# Patient Record
Sex: Female | Born: 1955 | Hispanic: No | Marital: Single
Health system: Southern US, Community
[De-identification: ages and names within clinical notes are randomized; demographics above are authoritative.]

---

## 2002-05-03 ENCOUNTER — Encounter: Admission: RE | Admit: 2002-05-03 | Discharge: 2002-05-03 | Payer: Self-pay | Admitting: Family Medicine

## 2002-05-03 ENCOUNTER — Encounter: Payer: Self-pay | Admitting: Family Medicine

## 2002-06-22 ENCOUNTER — Encounter: Admission: RE | Admit: 2002-06-22 | Discharge: 2002-06-22 | Payer: Self-pay | Admitting: *Deleted

## 2002-06-22 ENCOUNTER — Encounter: Payer: Self-pay | Admitting: *Deleted

## 2004-05-13 ENCOUNTER — Other Ambulatory Visit: Admission: RE | Admit: 2004-05-13 | Discharge: 2004-05-13 | Payer: Self-pay | Admitting: Family Medicine

## 2005-01-01 ENCOUNTER — Encounter: Admission: RE | Admit: 2005-01-01 | Discharge: 2005-01-01 | Payer: Self-pay | Admitting: Surgery

## 2005-01-26 ENCOUNTER — Ambulatory Visit (HOSPITAL_COMMUNITY): Admission: RE | Admit: 2005-01-26 | Discharge: 2005-01-26 | Payer: Self-pay | Admitting: Surgery

## 2005-01-26 ENCOUNTER — Encounter (INDEPENDENT_AMBULATORY_CARE_PROVIDER_SITE_OTHER): Payer: Self-pay | Admitting: *Deleted

## 2009-04-09 ENCOUNTER — Encounter: Admission: RE | Admit: 2009-04-09 | Discharge: 2009-04-09 | Payer: Self-pay | Admitting: Family Medicine

## 2010-07-11 NOTE — Op Note (Signed)
Sherri Hebert            ACCOUNT NO.:  [id number]   MEDICAL RECORD NO.:  [id number]          PATIENT TYPE:  AMB   LOCATION:  DAY                          FACILITY:  [facility]   PHYSICIAN:  Thomas A. Cornett, M.D.DATE OF BIRTH:  [birth year]   DATE OF PROCEDURE:  01/26/2005  DATE OF DISCHARGE:                                 OPERATIVE REPORT   PREOPERATIVE DIAGNOSIS:  Symptomatic cholelithiasis.   POSTOPERATIVE DIAGNOSIS:  Symptomatic cholelithiasis.   PROCEDURE:  Laparoscopic cholecystectomy with intraoperative cholangiogram.   SURGEON:  Dr. Harriette Bouillon.   ASSISTANT:  Dr. Sheppard Plumber. Davis.   ANESTHESIA:  General endotracheal anesthesia with 0.25% Sensorcaine local.   ESTIMATED BLOOD LOSS:  20 mL.   SPECIMEN:  Gallbladder with gallstones to pathology.   DRAINS:  None.   INDICATIONS FOR PROCEDURE:  The patient is a 55 year old female has had  progressive right upper quadrant pain and bouts of biliary colic off and on  for the last 2 years. Her attacks have worsened and she wished to have  cholecystectomy for treatment of symptomatic cholelithiasis. After informed  consent was obtained, the patient was brought to the operating room.   DESCRIPTION OF PROCEDURE:  The patient was brought to the operating room and  placed supine. After induction of general endotracheal anesthesia, the  abdomen was prepped and draped in a sterile fashion. A 1 cm supraumbilical  incision was made, dissection was carried down to the fascia and the fascia  was grasped with a Kocher. Prior to induction, antibiotics were given. A  small incision was made in the fascia and both sides of the fascia were then  grasped between separate Kocher's. I extended the incision to about a  centimeter. I placed a Kelly clamp into the preperitoneal space and pushed  through the peritoneum into the abdominal cavity. A pursestring suture of #0  Vicryl was placed and a 12-mm Hassan cannula was placed under direct  vision.  Pneumoperitoneum was then created to 15 mmHg and a laparoscope was placed.  Laparoscopy was done. No obvious hollow or solid organ injury was noted. The  gallbladder is identified and had some chronic inflammatory change. A 5 mL  port was placed in the subxiphoid position and two 5 mm ports were placed in  the right mid abdomen. The dome of the gallbladder was grasped and retracted  toward the patient's right shoulder. A second grasper was used to grab the  infundibulum. I scored the peritoneum with the cautery at the junction of  the cystic duct and gallbladder. I was able to dissect out the cystic duct  as the only tubular structure in the gallbladder. A clip was placed on the  gallbladder side of the cystic duct and a small incision was made in the  cystic duct for cholangiogram. Through a separate stab incision in the right  upper quadrant, a Cook catheter was introduced for cholangiogram. This was  placed in the cystic duct and held in place with a clip. Intraoperative  cholangiogram was then performed using fluoroscopy and 1/2 strength Hypaque  dye. There was a very long cystic duct  that appeared to enter posterior to  the common duct quite distal common duct. There was flow of contrast up the  common duct up into the right hepatic duct and I could visualize the  bifurcation of the right and left hepatic duct even though not a lot of  contrast went up the left hepatic duct.  There was free flow of contrast  into the duodenum. At this point, the cholangiogram catheter was removed and  the cystic duct stump was triple clipped and divided. The cystic artery was  identified. This was triple clipped and divided. There were numerous small  branches that were coming off the cystic artery since the gallbladder  appeared to be more on a mesentery than intrahepatic. It took these vessels  individually as I entered the gallbladder with clips, while other I was able  to cauterize due to  their small size. I was able to dissect the gallbladder  from the gallbladder fossa with good hemostasis. A 5 mm scope was then used  to extract the gallbladder through the umbilicus using an EndoCatch bag. I  then re-exchanged back to a 10 mm scope to examine the gallbladder bed.  Clips were on the cystic duct and cystic artery branches respectively with  no evidence of bleeding. There was good hemostasis within the gallbladder  bed. Irrigation was used and suctioned out. I reinspected the gallbladder  bed and found it to be hemostatic. There is no evidence of any solid or  hollow organ injury at this point in time. All ports were subsequently  removed with no signs of port site bleeding. The camera was withdrawn, the  CO2 was released and the umbilical port was passed off the field. The fascia  at the umbilicus was closed with the previously placed pursestring suture of  Vicryl. 4-0 Monocryl was used to close off skin incisions. Steri-Strips and  dry dressings were applied. All final counts of sponge, needle and  instruments were found to be correct at this portion of the case. The  patient was awoke and taken to recovery in satisfactory condition.      Thomas A. Cornett, M.D.  Electronically Signed     TAC/MEDQ  D:  01/26/2005  T:  01/26/2005  Job:  [id number]   cc:   Schuyler Amor, M.D.  Fax: [phone]

## 2013-03-30 ENCOUNTER — Other Ambulatory Visit: Payer: Self-pay | Admitting: Family Medicine

## 2013-03-30 ENCOUNTER — Ambulatory Visit
Admission: RE | Admit: 2013-03-30 | Discharge: 2013-03-30 | Disposition: A | Discharge status: Home / Self Care | Payer: BC Managed Care – PPO | Source: Ambulatory Visit | Attending: Family Medicine | Admitting: Family Medicine

## 2013-03-30 DIAGNOSIS — R079 Chest pain, unspecified: Secondary | ICD-10-CM

## 2013-03-30 DIAGNOSIS — N644 Mastodynia: Secondary | ICD-10-CM

## 2013-04-14 ENCOUNTER — Other Ambulatory Visit: Payer: Self-pay | Admitting: Family Medicine

## 2013-04-14 ENCOUNTER — Ambulatory Visit
Admission: RE | Admit: 2013-04-14 | Discharge: 2013-04-14 | Disposition: A | Discharge status: Home / Self Care | Payer: BC Managed Care – PPO | Source: Ambulatory Visit | Attending: Family Medicine | Admitting: Family Medicine

## 2013-04-14 DIAGNOSIS — N644 Mastodynia: Secondary | ICD-10-CM

## 2013-04-28 ENCOUNTER — Other Ambulatory Visit (HOSPITAL_COMMUNITY)
Admission: RE | Admit: 2013-04-28 | Discharge: 2013-04-28 | Disposition: A | Discharge status: Home / Self Care | Payer: BC Managed Care – PPO | Source: Ambulatory Visit | Attending: Family Medicine | Admitting: Family Medicine

## 2013-04-28 ENCOUNTER — Other Ambulatory Visit: Payer: Self-pay | Admitting: Family Medicine

## 2013-04-28 DIAGNOSIS — Z124 Encounter for screening for malignant neoplasm of cervix: Secondary | ICD-10-CM | POA: Insufficient documentation

## 2013-04-28 DIAGNOSIS — Z1151 Encounter for screening for human papillomavirus (HPV): Secondary | ICD-10-CM | POA: Insufficient documentation

## 2013-09-25 ENCOUNTER — Other Ambulatory Visit: Payer: Self-pay | Admitting: Family Medicine

## 2013-09-25 DIAGNOSIS — N63 Unspecified lump in unspecified breast: Secondary | ICD-10-CM

## 2013-10-12 ENCOUNTER — Encounter (INDEPENDENT_AMBULATORY_CARE_PROVIDER_SITE_OTHER): Payer: Self-pay

## 2013-10-12 ENCOUNTER — Ambulatory Visit
Admission: RE | Admit: 2013-10-12 | Discharge: 2013-10-12 | Disposition: A | Discharge status: Home / Self Care | Payer: BC Managed Care – PPO | Source: Ambulatory Visit | Attending: Family Medicine | Admitting: Family Medicine

## 2013-10-12 DIAGNOSIS — N63 Unspecified lump in unspecified breast: Secondary | ICD-10-CM

## 2014-03-22 ENCOUNTER — Other Ambulatory Visit: Payer: Self-pay | Admitting: Family Medicine

## 2014-03-22 DIAGNOSIS — N63 Unspecified lump in unspecified breast: Secondary | ICD-10-CM

## 2014-04-02 ENCOUNTER — Other Ambulatory Visit: Payer: Self-pay | Admitting: Internal Medicine

## 2014-04-02 DIAGNOSIS — R319 Hematuria, unspecified: Secondary | ICD-10-CM

## 2014-04-02 DIAGNOSIS — R109 Unspecified abdominal pain: Secondary | ICD-10-CM

## 2014-04-16 ENCOUNTER — Ambulatory Visit
Admission: RE | Admit: 2014-04-16 | Discharge: 2014-04-16 | Disposition: A | Discharge status: Home / Self Care | Payer: BLUE CROSS/BLUE SHIELD | Source: Ambulatory Visit | Attending: Family Medicine | Admitting: Family Medicine

## 2014-04-16 DIAGNOSIS — N63 Unspecified lump in unspecified breast: Secondary | ICD-10-CM

## 2014-09-15 IMAGING — MG MM DIGITAL DIAGNOSTIC BILAT CAD
6 series · 6 of 6 positions shown · non-contrast
Comparison: None.

CLINICAL DATA: Patient states that she had diffuse right breast
pain that has resolved. She has no current complaints.

EXAM:
DIGITAL DIAGNOSTIC  BILATERAL MAMMOGRAM WITH CAD
ULTRASOUND RIGHT BREAST

[R CC (1 of 2)]
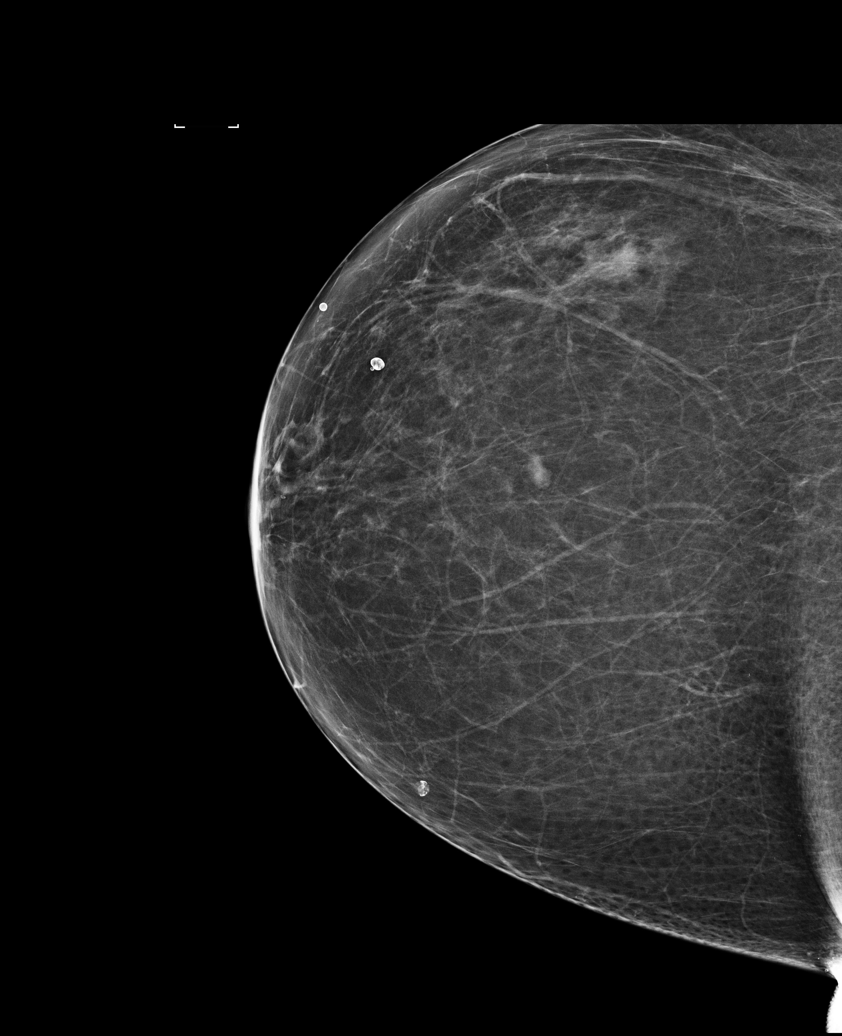

[L CC]
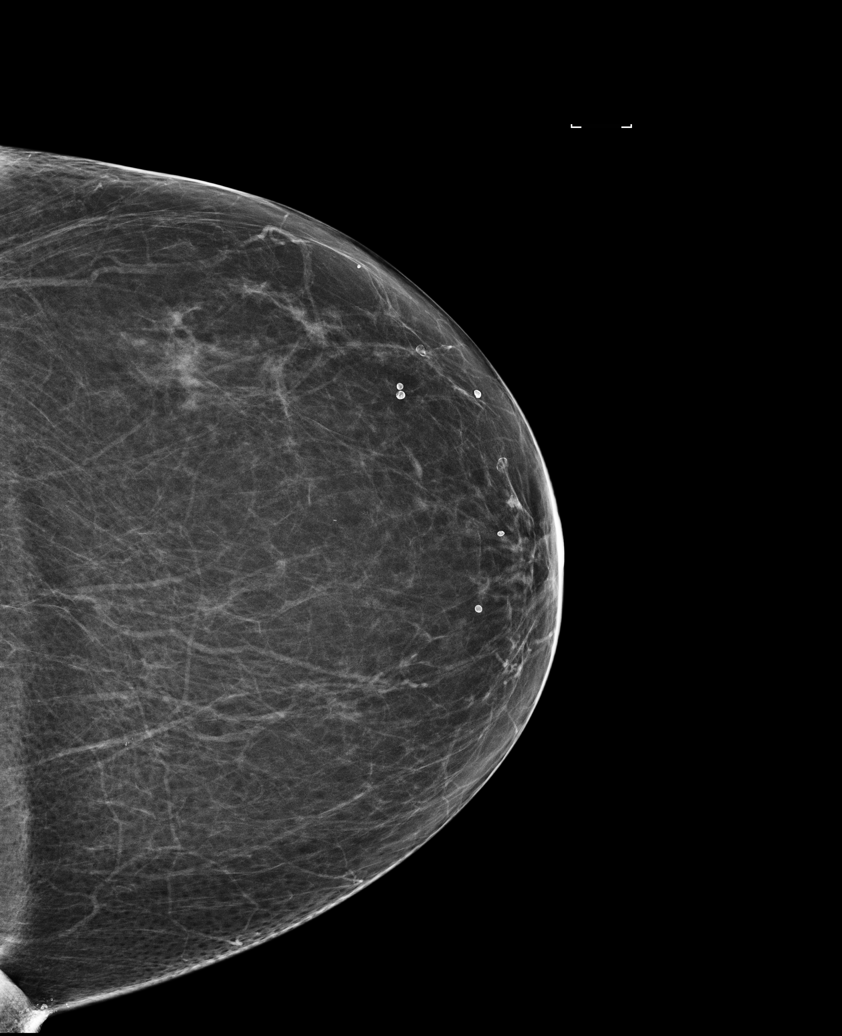

[R CC (2 of 2)]
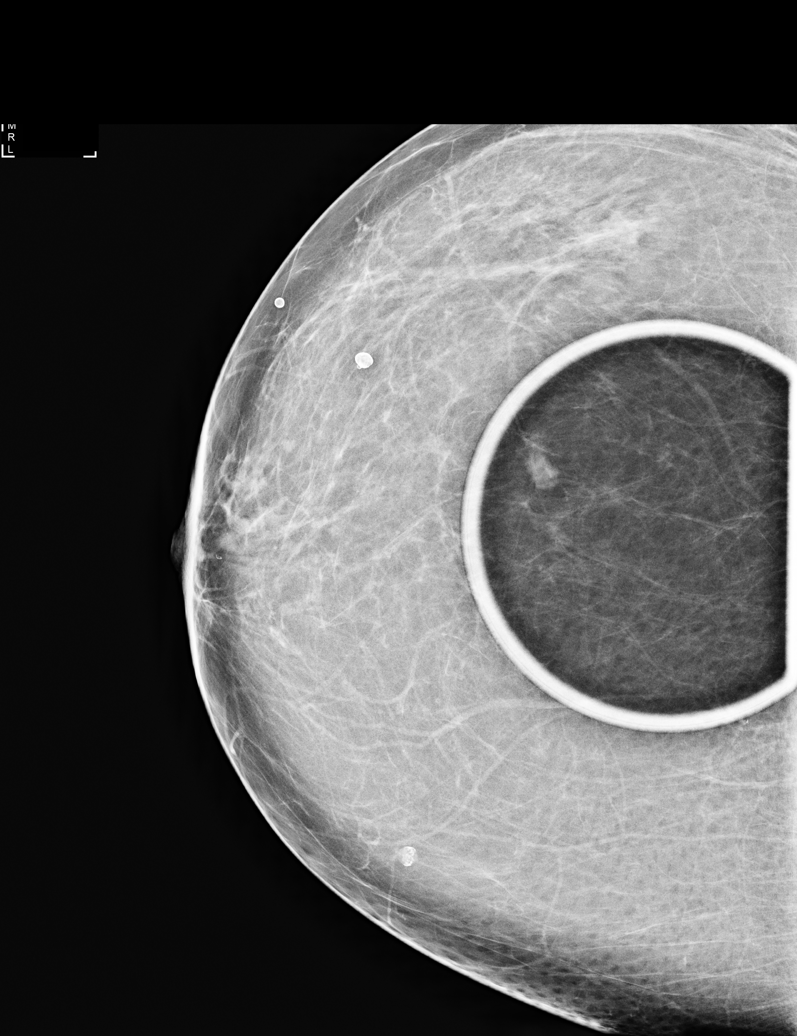

[R MLO (1 of 2)]
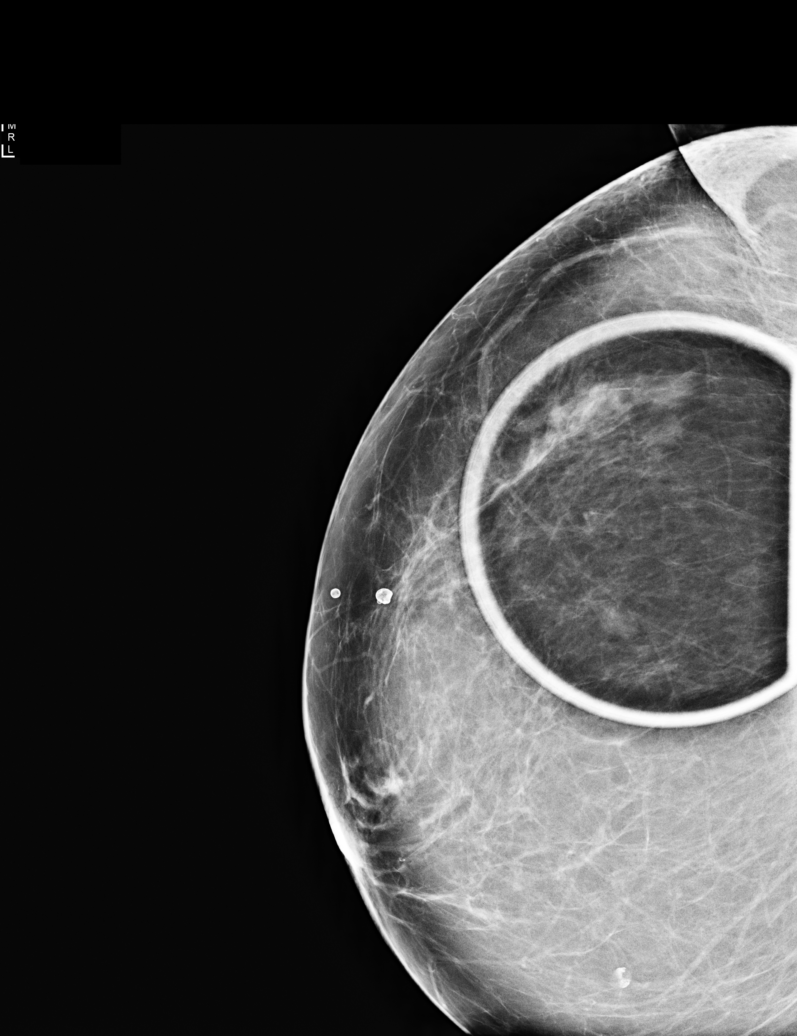

[R MLO (2 of 2)]
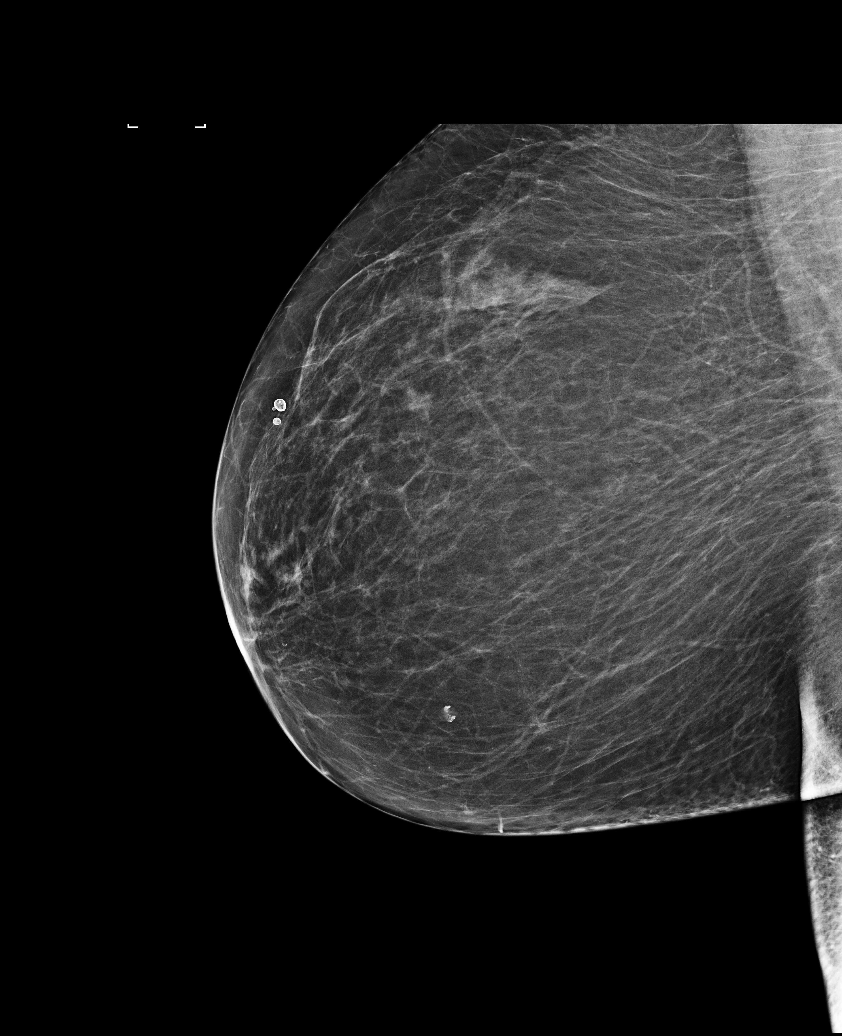

[L MLO]
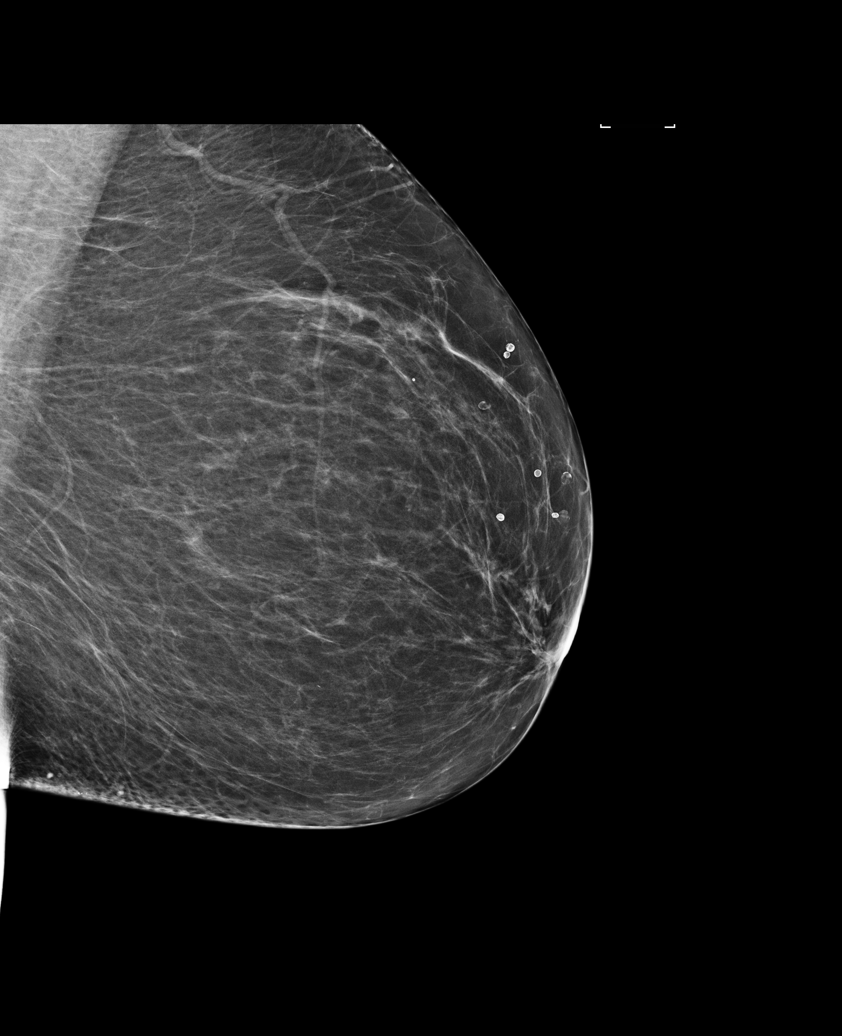

[6 of 6 positions shown; findings below may reference images not displayed]

ACR Breast Density Category b: There are scattered areas of
fibroglandular density.
FINDINGS: No suspicious calcifications identified in either breast. The
patient has bilateral calcified oil cysts. In the middle third of
the upper-outer quadrant of the right breast is a low-density
obscured 9 mm nodule. No suspicious mass is seen in the left breast.

Mammographic images were processed with CAD.

On physical exam, I do not palpate a mass in the right breast.

Ultrasound is performed, showing no sonographic correlate are is
seen in the upper-outer quadrant of the right breast. The
low-density nodule is not visualized.
IMPRESSION: Probable benign nodule in the right breast.

RECOMMENDATION:
Short-term interval follow-up right mammogram in 6 months is
recommended.

I have discussed the findings and recommendations with the patient.
Results were also provided in writing at the conclusion of the
visit. If applicable, a reminder letter will be sent to the patient
regarding the next appointment.

BI-RADS CATEGORY  3: Probably benign finding(s) - short interval
follow-up suggested.

## 2015-03-29 ENCOUNTER — Other Ambulatory Visit: Payer: Self-pay | Admitting: Family Medicine

## 2015-03-29 DIAGNOSIS — N6489 Other specified disorders of breast: Secondary | ICD-10-CM

## 2015-05-31 DIAGNOSIS — Z1231 Encounter for screening mammogram for malignant neoplasm of breast: Secondary | ICD-10-CM | POA: Diagnosis not present

## 2015-06-18 DIAGNOSIS — M5416 Radiculopathy, lumbar region: Secondary | ICD-10-CM | POA: Diagnosis not present

## 2015-06-18 DIAGNOSIS — M545 Low back pain: Secondary | ICD-10-CM | POA: Diagnosis not present

## 2015-06-18 DIAGNOSIS — M5136 Other intervertebral disc degeneration, lumbar region: Secondary | ICD-10-CM | POA: Diagnosis not present

## 2015-06-18 DIAGNOSIS — L218 Other seborrheic dermatitis: Secondary | ICD-10-CM | POA: Diagnosis not present

## 2015-06-18 DIAGNOSIS — M47816 Spondylosis without myelopathy or radiculopathy, lumbar region: Secondary | ICD-10-CM | POA: Diagnosis not present

## 2015-07-30 DIAGNOSIS — E039 Hypothyroidism, unspecified: Secondary | ICD-10-CM | POA: Diagnosis not present

## 2015-07-30 DIAGNOSIS — M2550 Pain in unspecified joint: Secondary | ICD-10-CM | POA: Diagnosis not present

## 2015-07-30 DIAGNOSIS — M79641 Pain in right hand: Secondary | ICD-10-CM | POA: Diagnosis not present

## 2015-07-30 DIAGNOSIS — M79642 Pain in left hand: Secondary | ICD-10-CM | POA: Diagnosis not present

## 2015-07-30 DIAGNOSIS — Z79899 Other long term (current) drug therapy: Secondary | ICD-10-CM | POA: Diagnosis not present

## 2015-07-30 DIAGNOSIS — D485 Neoplasm of uncertain behavior of skin: Secondary | ICD-10-CM | POA: Diagnosis not present

## 2015-07-30 DIAGNOSIS — M069 Rheumatoid arthritis, unspecified: Secondary | ICD-10-CM | POA: Diagnosis not present

## 2015-07-30 DIAGNOSIS — L573 Poikiloderma of Civatte: Secondary | ICD-10-CM | POA: Diagnosis not present

## 2015-07-30 DIAGNOSIS — M0609 Rheumatoid arthritis without rheumatoid factor, multiple sites: Secondary | ICD-10-CM | POA: Diagnosis not present

## 2015-07-30 DIAGNOSIS — L219 Seborrheic dermatitis, unspecified: Secondary | ICD-10-CM | POA: Diagnosis not present

## 2015-07-30 DIAGNOSIS — L218 Other seborrheic dermatitis: Secondary | ICD-10-CM | POA: Diagnosis not present

## 2015-07-30 DIAGNOSIS — M5136 Other intervertebral disc degeneration, lumbar region: Secondary | ICD-10-CM | POA: Diagnosis not present

## 2015-10-01 DIAGNOSIS — M069 Rheumatoid arthritis, unspecified: Secondary | ICD-10-CM | POA: Diagnosis not present

## 2015-10-01 DIAGNOSIS — I10 Essential (primary) hypertension: Secondary | ICD-10-CM | POA: Diagnosis not present

## 2015-11-05 DIAGNOSIS — M545 Low back pain: Secondary | ICD-10-CM | POA: Diagnosis not present

## 2015-11-05 DIAGNOSIS — M2550 Pain in unspecified joint: Secondary | ICD-10-CM | POA: Diagnosis not present

## 2015-11-05 DIAGNOSIS — M5136 Other intervertebral disc degeneration, lumbar region: Secondary | ICD-10-CM | POA: Diagnosis not present

## 2015-11-05 DIAGNOSIS — M0609 Rheumatoid arthritis without rheumatoid factor, multiple sites: Secondary | ICD-10-CM | POA: Diagnosis not present

## 2016-02-10 DIAGNOSIS — E559 Vitamin D deficiency, unspecified: Secondary | ICD-10-CM | POA: Diagnosis not present

## 2016-02-28 DIAGNOSIS — R7301 Impaired fasting glucose: Secondary | ICD-10-CM | POA: Diagnosis not present

## 2016-02-28 DIAGNOSIS — E039 Hypothyroidism, unspecified: Secondary | ICD-10-CM | POA: Diagnosis not present

## 2016-02-28 DIAGNOSIS — R635 Abnormal weight gain: Secondary | ICD-10-CM | POA: Diagnosis not present

## 2016-03-20 DIAGNOSIS — M5136 Other intervertebral disc degeneration, lumbar region: Secondary | ICD-10-CM | POA: Diagnosis not present

## 2016-03-20 DIAGNOSIS — R5383 Other fatigue: Secondary | ICD-10-CM | POA: Diagnosis not present

## 2016-03-20 DIAGNOSIS — M2550 Pain in unspecified joint: Secondary | ICD-10-CM | POA: Diagnosis not present

## 2016-03-20 DIAGNOSIS — M545 Low back pain: Secondary | ICD-10-CM | POA: Diagnosis not present

## 2016-03-20 DIAGNOSIS — M0609 Rheumatoid arthritis without rheumatoid factor, multiple sites: Secondary | ICD-10-CM | POA: Diagnosis not present

## 2016-05-06 DIAGNOSIS — M0609 Rheumatoid arthritis without rheumatoid factor, multiple sites: Secondary | ICD-10-CM | POA: Diagnosis not present

## 2016-05-07 ENCOUNTER — Other Ambulatory Visit (HOSPITAL_COMMUNITY)
Admission: RE | Admit: 2016-05-07 | Discharge: 2016-05-07 | Disposition: A | Discharge status: Home / Self Care | Payer: BLUE CROSS/BLUE SHIELD | Source: Ambulatory Visit | Attending: Family Medicine | Admitting: Family Medicine

## 2016-05-07 ENCOUNTER — Other Ambulatory Visit: Payer: Self-pay | Admitting: Family Medicine

## 2016-05-07 DIAGNOSIS — Z0000 Encounter for general adult medical examination without abnormal findings: Secondary | ICD-10-CM | POA: Diagnosis not present

## 2016-05-07 DIAGNOSIS — R7303 Prediabetes: Secondary | ICD-10-CM | POA: Diagnosis not present

## 2016-05-07 DIAGNOSIS — I10 Essential (primary) hypertension: Secondary | ICD-10-CM | POA: Diagnosis not present

## 2016-05-07 DIAGNOSIS — Z01411 Encounter for gynecological examination (general) (routine) with abnormal findings: Secondary | ICD-10-CM | POA: Insufficient documentation

## 2016-05-08 DIAGNOSIS — M79672 Pain in left foot: Secondary | ICD-10-CM | POA: Diagnosis not present

## 2016-05-08 DIAGNOSIS — M5136 Other intervertebral disc degeneration, lumbar region: Secondary | ICD-10-CM | POA: Diagnosis not present

## 2016-05-08 DIAGNOSIS — M5416 Radiculopathy, lumbar region: Secondary | ICD-10-CM | POA: Diagnosis not present

## 2016-05-08 DIAGNOSIS — M47816 Spondylosis without myelopathy or radiculopathy, lumbar region: Secondary | ICD-10-CM | POA: Diagnosis not present

## 2016-05-11 DIAGNOSIS — G8929 Other chronic pain: Secondary | ICD-10-CM | POA: Diagnosis not present

## 2016-05-11 DIAGNOSIS — M5442 Lumbago with sciatica, left side: Secondary | ICD-10-CM | POA: Diagnosis not present

## 2016-05-11 LAB — CYTOLOGY - PAP: Diagnosis: NEGATIVE

## 2016-05-15 DIAGNOSIS — M5442 Lumbago with sciatica, left side: Secondary | ICD-10-CM | POA: Diagnosis not present

## 2016-05-15 DIAGNOSIS — G8929 Other chronic pain: Secondary | ICD-10-CM | POA: Diagnosis not present

## 2016-05-18 DIAGNOSIS — G8929 Other chronic pain: Secondary | ICD-10-CM | POA: Diagnosis not present

## 2016-05-18 DIAGNOSIS — M5442 Lumbago with sciatica, left side: Secondary | ICD-10-CM | POA: Diagnosis not present

## 2016-05-21 DIAGNOSIS — M5442 Lumbago with sciatica, left side: Secondary | ICD-10-CM | POA: Diagnosis not present

## 2016-05-21 DIAGNOSIS — G8929 Other chronic pain: Secondary | ICD-10-CM | POA: Diagnosis not present

## 2016-06-01 DIAGNOSIS — M0609 Rheumatoid arthritis without rheumatoid factor, multiple sites: Secondary | ICD-10-CM | POA: Diagnosis not present

## 2016-06-01 DIAGNOSIS — M5442 Lumbago with sciatica, left side: Secondary | ICD-10-CM | POA: Diagnosis not present

## 2016-06-01 DIAGNOSIS — G8929 Other chronic pain: Secondary | ICD-10-CM | POA: Diagnosis not present

## 2016-06-01 DIAGNOSIS — M5136 Other intervertebral disc degeneration, lumbar region: Secondary | ICD-10-CM | POA: Diagnosis not present

## 2016-06-01 DIAGNOSIS — M2550 Pain in unspecified joint: Secondary | ICD-10-CM | POA: Diagnosis not present

## 2016-06-01 DIAGNOSIS — Z79899 Other long term (current) drug therapy: Secondary | ICD-10-CM | POA: Diagnosis not present

## 2016-06-01 DIAGNOSIS — M545 Low back pain: Secondary | ICD-10-CM | POA: Diagnosis not present

## 2016-06-03 DIAGNOSIS — M0609 Rheumatoid arthritis without rheumatoid factor, multiple sites: Secondary | ICD-10-CM | POA: Diagnosis not present

## 2016-06-22 DIAGNOSIS — M79672 Pain in left foot: Secondary | ICD-10-CM | POA: Diagnosis not present

## 2016-06-22 DIAGNOSIS — M216X2 Other acquired deformities of left foot: Secondary | ICD-10-CM | POA: Diagnosis not present

## 2016-06-22 DIAGNOSIS — M7672 Peroneal tendinitis, left leg: Secondary | ICD-10-CM | POA: Diagnosis not present

## 2016-07-31 DIAGNOSIS — M5136 Other intervertebral disc degeneration, lumbar region: Secondary | ICD-10-CM | POA: Diagnosis not present

## 2016-07-31 DIAGNOSIS — M5416 Radiculopathy, lumbar region: Secondary | ICD-10-CM | POA: Diagnosis not present

## 2016-08-05 DIAGNOSIS — M0609 Rheumatoid arthritis without rheumatoid factor, multiple sites: Secondary | ICD-10-CM | POA: Diagnosis not present

## 2016-08-05 DIAGNOSIS — Z79899 Other long term (current) drug therapy: Secondary | ICD-10-CM | POA: Diagnosis not present

## 2016-08-14 DIAGNOSIS — M5136 Other intervertebral disc degeneration, lumbar region: Secondary | ICD-10-CM | POA: Diagnosis not present

## 2016-08-14 DIAGNOSIS — M5117 Intervertebral disc disorders with radiculopathy, lumbosacral region: Secondary | ICD-10-CM | POA: Diagnosis not present

## 2016-08-14 DIAGNOSIS — M545 Low back pain: Secondary | ICD-10-CM | POA: Diagnosis not present

## 2016-08-20 DIAGNOSIS — E559 Vitamin D deficiency, unspecified: Secondary | ICD-10-CM | POA: Diagnosis not present

## 2016-08-20 DIAGNOSIS — E039 Hypothyroidism, unspecified: Secondary | ICD-10-CM | POA: Diagnosis not present

## 2016-08-20 DIAGNOSIS — R635 Abnormal weight gain: Secondary | ICD-10-CM | POA: Diagnosis not present

## 2016-08-20 DIAGNOSIS — R7303 Prediabetes: Secondary | ICD-10-CM | POA: Diagnosis not present

## 2016-09-30 DIAGNOSIS — M0609 Rheumatoid arthritis without rheumatoid factor, multiple sites: Secondary | ICD-10-CM | POA: Diagnosis not present

## 2016-09-30 DIAGNOSIS — Z79899 Other long term (current) drug therapy: Secondary | ICD-10-CM | POA: Diagnosis not present

## 2016-10-01 DIAGNOSIS — M5136 Other intervertebral disc degeneration, lumbar region: Secondary | ICD-10-CM | POA: Diagnosis not present

## 2016-10-01 DIAGNOSIS — M0609 Rheumatoid arthritis without rheumatoid factor, multiple sites: Secondary | ICD-10-CM | POA: Diagnosis not present

## 2016-10-01 DIAGNOSIS — M2550 Pain in unspecified joint: Secondary | ICD-10-CM | POA: Diagnosis not present

## 2016-10-01 DIAGNOSIS — M545 Low back pain: Secondary | ICD-10-CM | POA: Diagnosis not present

## 2016-10-02 DIAGNOSIS — I10 Essential (primary) hypertension: Secondary | ICD-10-CM | POA: Diagnosis not present

## 2016-10-02 DIAGNOSIS — E039 Hypothyroidism, unspecified: Secondary | ICD-10-CM | POA: Diagnosis not present

## 2016-10-02 DIAGNOSIS — Z1211 Encounter for screening for malignant neoplasm of colon: Secondary | ICD-10-CM | POA: Diagnosis not present

## 2016-10-02 DIAGNOSIS — M069 Rheumatoid arthritis, unspecified: Secondary | ICD-10-CM | POA: Diagnosis not present

## 2016-10-18 DIAGNOSIS — R42 Dizziness and giddiness: Secondary | ICD-10-CM | POA: Diagnosis not present

## 2016-10-18 DIAGNOSIS — H6121 Impacted cerumen, right ear: Secondary | ICD-10-CM | POA: Diagnosis not present

## 2016-10-20 DIAGNOSIS — Z1211 Encounter for screening for malignant neoplasm of colon: Secondary | ICD-10-CM | POA: Diagnosis not present

## 2016-11-23 DIAGNOSIS — L308 Other specified dermatitis: Secondary | ICD-10-CM | POA: Diagnosis not present

## 2016-11-25 DIAGNOSIS — M0609 Rheumatoid arthritis without rheumatoid factor, multiple sites: Secondary | ICD-10-CM | POA: Diagnosis not present

## 2016-11-25 DIAGNOSIS — Z79899 Other long term (current) drug therapy: Secondary | ICD-10-CM | POA: Diagnosis not present

## 2016-12-14 DIAGNOSIS — D485 Neoplasm of uncertain behavior of skin: Secondary | ICD-10-CM | POA: Diagnosis not present

## 2016-12-14 DIAGNOSIS — L989 Disorder of the skin and subcutaneous tissue, unspecified: Secondary | ICD-10-CM | POA: Diagnosis not present

## 2017-01-20 DIAGNOSIS — M0609 Rheumatoid arthritis without rheumatoid factor, multiple sites: Secondary | ICD-10-CM | POA: Diagnosis not present

## 2017-03-15 DIAGNOSIS — R7303 Prediabetes: Secondary | ICD-10-CM | POA: Diagnosis not present

## 2017-03-17 DIAGNOSIS — Z79899 Other long term (current) drug therapy: Secondary | ICD-10-CM | POA: Diagnosis not present

## 2017-03-17 DIAGNOSIS — M0609 Rheumatoid arthritis without rheumatoid factor, multiple sites: Secondary | ICD-10-CM | POA: Diagnosis not present

## 2017-05-12 DIAGNOSIS — Z79899 Other long term (current) drug therapy: Secondary | ICD-10-CM | POA: Diagnosis not present

## 2017-05-12 DIAGNOSIS — Z1211 Encounter for screening for malignant neoplasm of colon: Secondary | ICD-10-CM | POA: Diagnosis not present

## 2017-05-12 DIAGNOSIS — I10 Essential (primary) hypertension: Secondary | ICD-10-CM | POA: Diagnosis not present

## 2017-05-12 DIAGNOSIS — E049 Nontoxic goiter, unspecified: Secondary | ICD-10-CM | POA: Diagnosis not present

## 2017-05-12 DIAGNOSIS — Z0000 Encounter for general adult medical examination without abnormal findings: Secondary | ICD-10-CM | POA: Diagnosis not present

## 2017-05-12 DIAGNOSIS — M0609 Rheumatoid arthritis without rheumatoid factor, multiple sites: Secondary | ICD-10-CM | POA: Diagnosis not present

## 2017-05-12 DIAGNOSIS — M519 Unspecified thoracic, thoracolumbar and lumbosacral intervertebral disc disorder: Secondary | ICD-10-CM | POA: Diagnosis not present

## 2017-05-12 DIAGNOSIS — R739 Hyperglycemia, unspecified: Secondary | ICD-10-CM | POA: Diagnosis not present

## 2017-05-12 DIAGNOSIS — E669 Obesity, unspecified: Secondary | ICD-10-CM | POA: Diagnosis not present

## 2017-05-12 DIAGNOSIS — E559 Vitamin D deficiency, unspecified: Secondary | ICD-10-CM | POA: Diagnosis not present

## 2017-06-16 DIAGNOSIS — M5136 Other intervertebral disc degeneration, lumbar region: Secondary | ICD-10-CM | POA: Diagnosis not present

## 2017-06-16 DIAGNOSIS — M0609 Rheumatoid arthritis without rheumatoid factor, multiple sites: Secondary | ICD-10-CM | POA: Diagnosis not present

## 2017-06-16 DIAGNOSIS — M2550 Pain in unspecified joint: Secondary | ICD-10-CM | POA: Diagnosis not present

## 2017-06-16 DIAGNOSIS — M545 Low back pain: Secondary | ICD-10-CM | POA: Diagnosis not present

## 2017-07-07 DIAGNOSIS — M0609 Rheumatoid arthritis without rheumatoid factor, multiple sites: Secondary | ICD-10-CM | POA: Diagnosis not present

## 2017-07-07 DIAGNOSIS — Z79899 Other long term (current) drug therapy: Secondary | ICD-10-CM | POA: Diagnosis not present

## 2017-08-20 DIAGNOSIS — E039 Hypothyroidism, unspecified: Secondary | ICD-10-CM | POA: Diagnosis not present

## 2017-09-01 DIAGNOSIS — Z79899 Other long term (current) drug therapy: Secondary | ICD-10-CM | POA: Diagnosis not present

## 2017-09-01 DIAGNOSIS — M0609 Rheumatoid arthritis without rheumatoid factor, multiple sites: Secondary | ICD-10-CM | POA: Diagnosis not present

## 2017-10-27 DIAGNOSIS — M0609 Rheumatoid arthritis without rheumatoid factor, multiple sites: Secondary | ICD-10-CM | POA: Diagnosis not present

## 2017-12-22 DIAGNOSIS — M0609 Rheumatoid arthritis without rheumatoid factor, multiple sites: Secondary | ICD-10-CM | POA: Diagnosis not present

## 2017-12-22 DIAGNOSIS — Z79899 Other long term (current) drug therapy: Secondary | ICD-10-CM | POA: Diagnosis not present

## 2018-02-14 DIAGNOSIS — I10 Essential (primary) hypertension: Secondary | ICD-10-CM | POA: Diagnosis not present

## 2018-02-14 DIAGNOSIS — E039 Hypothyroidism, unspecified: Secondary | ICD-10-CM | POA: Diagnosis not present

## 2018-02-15 DIAGNOSIS — M5136 Other intervertebral disc degeneration, lumbar region: Secondary | ICD-10-CM | POA: Diagnosis not present

## 2018-02-15 DIAGNOSIS — M0609 Rheumatoid arthritis without rheumatoid factor, multiple sites: Secondary | ICD-10-CM | POA: Diagnosis not present

## 2018-02-15 DIAGNOSIS — M545 Low back pain: Secondary | ICD-10-CM | POA: Diagnosis not present

## 2018-02-15 DIAGNOSIS — M2550 Pain in unspecified joint: Secondary | ICD-10-CM | POA: Diagnosis not present

## 2018-02-21 DIAGNOSIS — M0609 Rheumatoid arthritis without rheumatoid factor, multiple sites: Secondary | ICD-10-CM | POA: Diagnosis not present

## 2019-05-05 ENCOUNTER — Ambulatory Visit: Payer: Self-pay | Attending: Internal Medicine

## 2019-05-05 DIAGNOSIS — Z23 Encounter for immunization: Secondary | ICD-10-CM

## 2019-05-05 NOTE — Progress Notes (Signed)
   Covid-19 Vaccination Clinic  Name:  Azriel Dancy    MRN: [id number] DOB: 30-Dec-1955  05/05/2019  Ms. Waters was observed post Covid-19 immunization for 15 minutes without incident. She was provided with Vaccine Information Sheet and instruction to access the V-Safe system.   Ms. Arceneaux was instructed to call 911 with any severe reactions post vaccine: Marland Kitchen Difficulty breathing  . Swelling of face and throat  . A fast heartbeat  . A bad rash all over body  . Dizziness and weakness   Immunizations Administered    Name Date Dose VIS Date Route   Pfizer COVID-19 Vaccine 05/05/2019  9:05 AM 0.3 mL 02/03/2019 Intramuscular   Manufacturer: [employer], [employer]   Lot: [id number]   NDC: [address]-1

## 2019-05-29 ENCOUNTER — Ambulatory Visit: Payer: Self-pay | Attending: Internal Medicine

## 2019-05-29 DIAGNOSIS — Z23 Encounter for immunization: Secondary | ICD-10-CM

## 2019-05-29 NOTE — Progress Notes (Signed)
   Covid-19 Vaccination Clinic  Name:  Mazie Fencl    MRN: [id number] DOB: 09/18/1955  05/29/2019  Ms. Najarian was observed post Covid-19 immunization for 15 minutes without incident. She was provided with Vaccine Information Sheet and instruction to access the V-Safe system.   Ms. Draughn was instructed to call 911 with any severe reactions post vaccine: Marland Kitchen Difficulty breathing  . Swelling of face and throat  . A fast heartbeat  . A bad rash all over body  . Dizziness and weakness   Immunizations Administered    Name Date Dose VIS Date Route   Pfizer COVID-19 Vaccine 05/29/2019  2:38 PM 0.3 mL 02/03/2019 Intramuscular   Manufacturer: [employer], [employer]   Lot: [id number]   NDC: [address]-9

## 2019-06-15 ENCOUNTER — Ambulatory Visit: Payer: 59 | Admitting: Physical Therapy

## 2019-10-09 DIAGNOSIS — K5641 Fecal impaction: Secondary | ICD-10-CM | POA: Diagnosis not present

## 2019-10-10 DIAGNOSIS — K5641 Fecal impaction: Secondary | ICD-10-CM | POA: Diagnosis not present

## 2019-10-18 ENCOUNTER — Other Ambulatory Visit: Payer: Self-pay | Admitting: Family Medicine

## 2019-10-18 ENCOUNTER — Ambulatory Visit
Admission: RE | Admit: 2019-10-18 | Discharge: 2019-10-18 | Disposition: A | Discharge status: Home / Self Care | Payer: PPO | Source: Ambulatory Visit | Attending: Family Medicine | Admitting: Family Medicine

## 2019-10-18 DIAGNOSIS — K7689 Other specified diseases of liver: Secondary | ICD-10-CM | POA: Diagnosis not present

## 2019-10-18 DIAGNOSIS — R102 Pelvic and perineal pain: Secondary | ICD-10-CM | POA: Diagnosis not present

## 2019-10-18 DIAGNOSIS — I700 Atherosclerosis of aorta: Secondary | ICD-10-CM | POA: Diagnosis not present

## 2019-10-18 DIAGNOSIS — K5641 Fecal impaction: Secondary | ICD-10-CM

## 2019-10-18 DIAGNOSIS — R1030 Lower abdominal pain, unspecified: Secondary | ICD-10-CM | POA: Diagnosis not present

## 2019-10-18 DIAGNOSIS — R112 Nausea with vomiting, unspecified: Secondary | ICD-10-CM

## 2019-10-18 DIAGNOSIS — K6289 Other specified diseases of anus and rectum: Secondary | ICD-10-CM

## 2019-10-18 MED ORDER — IOPAMIDOL (ISOVUE-300) INJECTION 61%
100.0000 mL | Freq: Once | INTRAVENOUS | Status: AC | PRN
  Administered 2019-10-18: 100 mL via INTRAVENOUS

## 2019-11-15 DIAGNOSIS — E039 Hypothyroidism, unspecified: Secondary | ICD-10-CM | POA: Diagnosis not present

## 2019-11-15 DIAGNOSIS — R7303 Prediabetes: Secondary | ICD-10-CM | POA: Diagnosis not present

## 2019-11-15 DIAGNOSIS — E559 Vitamin D deficiency, unspecified: Secondary | ICD-10-CM | POA: Diagnosis not present

## 2019-11-15 DIAGNOSIS — I10 Essential (primary) hypertension: Secondary | ICD-10-CM | POA: Diagnosis not present

## 2019-11-15 DIAGNOSIS — E673 Hypervitaminosis D: Secondary | ICD-10-CM | POA: Diagnosis not present

## 2019-12-18 DIAGNOSIS — Z6833 Body mass index (BMI) 33.0-33.9, adult: Secondary | ICD-10-CM | POA: Diagnosis not present

## 2019-12-18 DIAGNOSIS — M2550 Pain in unspecified joint: Secondary | ICD-10-CM | POA: Diagnosis not present

## 2019-12-18 DIAGNOSIS — M0609 Rheumatoid arthritis without rheumatoid factor, multiple sites: Secondary | ICD-10-CM | POA: Diagnosis not present

## 2019-12-18 DIAGNOSIS — M5136 Other intervertebral disc degeneration, lumbar region: Secondary | ICD-10-CM | POA: Diagnosis not present

## 2019-12-18 DIAGNOSIS — Z79899 Other long term (current) drug therapy: Secondary | ICD-10-CM | POA: Diagnosis not present

## 2019-12-18 DIAGNOSIS — E669 Obesity, unspecified: Secondary | ICD-10-CM | POA: Diagnosis not present

## 2019-12-26 DIAGNOSIS — H25812 Combined forms of age-related cataract, left eye: Secondary | ICD-10-CM | POA: Diagnosis not present

## 2019-12-26 DIAGNOSIS — Z01818 Encounter for other preprocedural examination: Secondary | ICD-10-CM | POA: Diagnosis not present

## 2020-01-11 DIAGNOSIS — H25812 Combined forms of age-related cataract, left eye: Secondary | ICD-10-CM | POA: Diagnosis not present

## 2020-01-11 DIAGNOSIS — H2512 Age-related nuclear cataract, left eye: Secondary | ICD-10-CM | POA: Diagnosis not present

## 2020-04-23 DIAGNOSIS — R7303 Prediabetes: Secondary | ICD-10-CM | POA: Diagnosis not present

## 2020-04-23 DIAGNOSIS — Z23 Encounter for immunization: Secondary | ICD-10-CM | POA: Diagnosis not present

## 2020-04-23 DIAGNOSIS — I10 Essential (primary) hypertension: Secondary | ICD-10-CM | POA: Diagnosis not present

## 2020-04-23 DIAGNOSIS — E559 Vitamin D deficiency, unspecified: Secondary | ICD-10-CM | POA: Diagnosis not present

## 2020-04-23 DIAGNOSIS — M519 Unspecified thoracic, thoracolumbar and lumbosacral intervertebral disc disorder: Secondary | ICD-10-CM | POA: Diagnosis not present

## 2020-04-23 DIAGNOSIS — Z79899 Other long term (current) drug therapy: Secondary | ICD-10-CM | POA: Diagnosis not present

## 2020-04-23 DIAGNOSIS — Z0000 Encounter for general adult medical examination without abnormal findings: Secondary | ICD-10-CM | POA: Diagnosis not present

## 2020-04-23 DIAGNOSIS — I700 Atherosclerosis of aorta: Secondary | ICD-10-CM | POA: Diagnosis not present

## 2020-04-23 DIAGNOSIS — Z6834 Body mass index (BMI) 34.0-34.9, adult: Secondary | ICD-10-CM | POA: Diagnosis not present

## 2020-04-23 DIAGNOSIS — M069 Rheumatoid arthritis, unspecified: Secondary | ICD-10-CM | POA: Diagnosis not present

## 2020-04-23 DIAGNOSIS — E039 Hypothyroidism, unspecified: Secondary | ICD-10-CM | POA: Diagnosis not present

## 2020-04-23 DIAGNOSIS — E049 Nontoxic goiter, unspecified: Secondary | ICD-10-CM | POA: Diagnosis not present

## 2020-05-30 DIAGNOSIS — I10 Essential (primary) hypertension: Secondary | ICD-10-CM | POA: Diagnosis not present

## 2020-06-17 DIAGNOSIS — Z6834 Body mass index (BMI) 34.0-34.9, adult: Secondary | ICD-10-CM | POA: Diagnosis not present

## 2020-06-17 DIAGNOSIS — Z79899 Other long term (current) drug therapy: Secondary | ICD-10-CM | POA: Diagnosis not present

## 2020-06-17 DIAGNOSIS — M5136 Other intervertebral disc degeneration, lumbar region: Secondary | ICD-10-CM | POA: Diagnosis not present

## 2020-06-17 DIAGNOSIS — E669 Obesity, unspecified: Secondary | ICD-10-CM | POA: Diagnosis not present

## 2020-06-17 DIAGNOSIS — M0609 Rheumatoid arthritis without rheumatoid factor, multiple sites: Secondary | ICD-10-CM | POA: Diagnosis not present

## 2020-06-17 DIAGNOSIS — M2550 Pain in unspecified joint: Secondary | ICD-10-CM | POA: Diagnosis not present

## 2020-06-26 DIAGNOSIS — M0609 Rheumatoid arthritis without rheumatoid factor, multiple sites: Secondary | ICD-10-CM | POA: Diagnosis not present

## 2020-07-24 DIAGNOSIS — M25562 Pain in left knee: Secondary | ICD-10-CM | POA: Diagnosis not present

## 2020-07-24 DIAGNOSIS — Z6835 Body mass index (BMI) 35.0-35.9, adult: Secondary | ICD-10-CM | POA: Diagnosis not present

## 2020-07-24 DIAGNOSIS — M519 Unspecified thoracic, thoracolumbar and lumbosacral intervertebral disc disorder: Secondary | ICD-10-CM | POA: Diagnosis not present

## 2020-07-24 DIAGNOSIS — I10 Essential (primary) hypertension: Secondary | ICD-10-CM | POA: Diagnosis not present

## 2020-07-25 DIAGNOSIS — M0609 Rheumatoid arthritis without rheumatoid factor, multiple sites: Secondary | ICD-10-CM | POA: Diagnosis not present

## 2020-09-19 DIAGNOSIS — M0609 Rheumatoid arthritis without rheumatoid factor, multiple sites: Secondary | ICD-10-CM | POA: Diagnosis not present

## 2020-10-22 DIAGNOSIS — M2550 Pain in unspecified joint: Secondary | ICD-10-CM | POA: Diagnosis not present

## 2020-10-22 DIAGNOSIS — Z6834 Body mass index (BMI) 34.0-34.9, adult: Secondary | ICD-10-CM | POA: Diagnosis not present

## 2020-10-22 DIAGNOSIS — M0609 Rheumatoid arthritis without rheumatoid factor, multiple sites: Secondary | ICD-10-CM | POA: Diagnosis not present

## 2020-10-22 DIAGNOSIS — M5136 Other intervertebral disc degeneration, lumbar region: Secondary | ICD-10-CM | POA: Diagnosis not present

## 2020-10-22 DIAGNOSIS — E669 Obesity, unspecified: Secondary | ICD-10-CM | POA: Diagnosis not present

## 2020-10-22 DIAGNOSIS — Z79899 Other long term (current) drug therapy: Secondary | ICD-10-CM | POA: Diagnosis not present

## 2020-11-14 DIAGNOSIS — M0609 Rheumatoid arthritis without rheumatoid factor, multiple sites: Secondary | ICD-10-CM | POA: Diagnosis not present

## 2020-12-25 DIAGNOSIS — L218 Other seborrheic dermatitis: Secondary | ICD-10-CM | POA: Diagnosis not present

## 2020-12-25 DIAGNOSIS — L281 Prurigo nodularis: Secondary | ICD-10-CM | POA: Diagnosis not present

## 2020-12-25 DIAGNOSIS — L298 Other pruritus: Secondary | ICD-10-CM | POA: Diagnosis not present

## 2020-12-25 DIAGNOSIS — Z789 Other specified health status: Secondary | ICD-10-CM | POA: Diagnosis not present

## 2021-01-01 DIAGNOSIS — E039 Hypothyroidism, unspecified: Secondary | ICD-10-CM | POA: Diagnosis not present

## 2021-01-01 DIAGNOSIS — M069 Rheumatoid arthritis, unspecified: Secondary | ICD-10-CM | POA: Diagnosis not present

## 2021-01-01 DIAGNOSIS — R7303 Prediabetes: Secondary | ICD-10-CM | POA: Diagnosis not present

## 2021-01-01 DIAGNOSIS — E2839 Other primary ovarian failure: Secondary | ICD-10-CM | POA: Diagnosis not present

## 2021-01-01 DIAGNOSIS — Z23 Encounter for immunization: Secondary | ICD-10-CM | POA: Diagnosis not present

## 2021-01-08 ENCOUNTER — Other Ambulatory Visit: Payer: Self-pay | Admitting: Family Medicine

## 2021-01-08 DIAGNOSIS — E2839 Other primary ovarian failure: Secondary | ICD-10-CM

## 2021-01-09 DIAGNOSIS — Z79899 Other long term (current) drug therapy: Secondary | ICD-10-CM | POA: Diagnosis not present

## 2021-01-09 DIAGNOSIS — R5383 Other fatigue: Secondary | ICD-10-CM | POA: Diagnosis not present

## 2021-01-09 DIAGNOSIS — M0609 Rheumatoid arthritis without rheumatoid factor, multiple sites: Secondary | ICD-10-CM | POA: Diagnosis not present

## 2021-01-22 DIAGNOSIS — Z789 Other specified health status: Secondary | ICD-10-CM | POA: Diagnosis not present

## 2021-01-22 DIAGNOSIS — R208 Other disturbances of skin sensation: Secondary | ICD-10-CM | POA: Diagnosis not present

## 2021-01-22 DIAGNOSIS — L281 Prurigo nodularis: Secondary | ICD-10-CM | POA: Diagnosis not present

## 2021-02-10 DIAGNOSIS — R208 Other disturbances of skin sensation: Secondary | ICD-10-CM | POA: Diagnosis not present

## 2021-02-10 DIAGNOSIS — Z789 Other specified health status: Secondary | ICD-10-CM | POA: Diagnosis not present

## 2021-02-10 DIAGNOSIS — L281 Prurigo nodularis: Secondary | ICD-10-CM | POA: Diagnosis not present

## 2021-02-10 DIAGNOSIS — E039 Hypothyroidism, unspecified: Secondary | ICD-10-CM | POA: Diagnosis not present

## 2021-03-14 DIAGNOSIS — Z789 Other specified health status: Secondary | ICD-10-CM | POA: Diagnosis not present

## 2021-03-14 DIAGNOSIS — L538 Other specified erythematous conditions: Secondary | ICD-10-CM | POA: Diagnosis not present

## 2021-03-14 DIAGNOSIS — L298 Other pruritus: Secondary | ICD-10-CM | POA: Diagnosis not present

## 2021-03-14 DIAGNOSIS — L281 Prurigo nodularis: Secondary | ICD-10-CM | POA: Diagnosis not present

## 2021-03-25 DIAGNOSIS — R059 Cough, unspecified: Secondary | ICD-10-CM | POA: Diagnosis not present

## 2021-03-25 DIAGNOSIS — J988 Other specified respiratory disorders: Secondary | ICD-10-CM | POA: Diagnosis not present

## 2021-04-04 DIAGNOSIS — M0609 Rheumatoid arthritis without rheumatoid factor, multiple sites: Secondary | ICD-10-CM | POA: Diagnosis not present

## 2021-04-15 DIAGNOSIS — L281 Prurigo nodularis: Secondary | ICD-10-CM | POA: Diagnosis not present

## 2021-04-15 DIAGNOSIS — L280 Lichen simplex chronicus: Secondary | ICD-10-CM | POA: Diagnosis not present

## 2021-04-29 DIAGNOSIS — E669 Obesity, unspecified: Secondary | ICD-10-CM | POA: Diagnosis not present

## 2021-04-29 DIAGNOSIS — M542 Cervicalgia: Secondary | ICD-10-CM | POA: Diagnosis not present

## 2021-04-29 DIAGNOSIS — Z79899 Other long term (current) drug therapy: Secondary | ICD-10-CM | POA: Diagnosis not present

## 2021-04-29 DIAGNOSIS — M0609 Rheumatoid arthritis without rheumatoid factor, multiple sites: Secondary | ICD-10-CM | POA: Diagnosis not present

## 2021-04-29 DIAGNOSIS — Z6834 Body mass index (BMI) 34.0-34.9, adult: Secondary | ICD-10-CM | POA: Diagnosis not present

## 2021-04-29 DIAGNOSIS — M5136 Other intervertebral disc degeneration, lumbar region: Secondary | ICD-10-CM | POA: Diagnosis not present

## 2021-05-16 DIAGNOSIS — M519 Unspecified thoracic, thoracolumbar and lumbosacral intervertebral disc disorder: Secondary | ICD-10-CM | POA: Diagnosis not present

## 2021-05-16 DIAGNOSIS — I10 Essential (primary) hypertension: Secondary | ICD-10-CM | POA: Diagnosis not present

## 2021-05-16 DIAGNOSIS — Z0000 Encounter for general adult medical examination without abnormal findings: Secondary | ICD-10-CM | POA: Diagnosis not present

## 2021-05-16 DIAGNOSIS — I700 Atherosclerosis of aorta: Secondary | ICD-10-CM | POA: Diagnosis not present

## 2021-05-16 DIAGNOSIS — R7303 Prediabetes: Secondary | ICD-10-CM | POA: Diagnosis not present

## 2021-05-16 DIAGNOSIS — Z79899 Other long term (current) drug therapy: Secondary | ICD-10-CM | POA: Diagnosis not present

## 2021-05-16 DIAGNOSIS — E039 Hypothyroidism, unspecified: Secondary | ICD-10-CM | POA: Diagnosis not present

## 2021-05-16 DIAGNOSIS — E049 Nontoxic goiter, unspecified: Secondary | ICD-10-CM | POA: Diagnosis not present

## 2021-05-16 DIAGNOSIS — E559 Vitamin D deficiency, unspecified: Secondary | ICD-10-CM | POA: Diagnosis not present

## 2021-05-16 DIAGNOSIS — E8881 Metabolic syndrome: Secondary | ICD-10-CM | POA: Diagnosis not present

## 2021-05-16 DIAGNOSIS — F330 Major depressive disorder, recurrent, mild: Secondary | ICD-10-CM | POA: Diagnosis not present

## 2021-06-02 DIAGNOSIS — M0609 Rheumatoid arthritis without rheumatoid factor, multiple sites: Secondary | ICD-10-CM | POA: Diagnosis not present

## 2021-08-04 DIAGNOSIS — M0609 Rheumatoid arthritis without rheumatoid factor, multiple sites: Secondary | ICD-10-CM | POA: Diagnosis not present

## 2021-08-18 DIAGNOSIS — L739 Follicular disorder, unspecified: Secondary | ICD-10-CM | POA: Diagnosis not present

## 2021-08-18 DIAGNOSIS — Z6833 Body mass index (BMI) 33.0-33.9, adult: Secondary | ICD-10-CM | POA: Diagnosis not present

## 2021-08-18 DIAGNOSIS — E039 Hypothyroidism, unspecified: Secondary | ICD-10-CM | POA: Diagnosis not present

## 2021-08-18 DIAGNOSIS — R7303 Prediabetes: Secondary | ICD-10-CM | POA: Diagnosis not present

## 2021-08-18 DIAGNOSIS — E559 Vitamin D deficiency, unspecified: Secondary | ICD-10-CM | POA: Diagnosis not present

## 2021-08-18 DIAGNOSIS — L719 Rosacea, unspecified: Secondary | ICD-10-CM | POA: Diagnosis not present

## 2021-08-18 DIAGNOSIS — F17210 Nicotine dependence, cigarettes, uncomplicated: Secondary | ICD-10-CM | POA: Diagnosis not present

## 2021-09-29 DIAGNOSIS — M0609 Rheumatoid arthritis without rheumatoid factor, multiple sites: Secondary | ICD-10-CM | POA: Diagnosis not present

## 2021-10-13 DIAGNOSIS — R7303 Prediabetes: Secondary | ICD-10-CM | POA: Diagnosis not present

## 2021-10-13 DIAGNOSIS — E668 Other obesity: Secondary | ICD-10-CM | POA: Diagnosis not present

## 2021-10-13 DIAGNOSIS — L309 Dermatitis, unspecified: Secondary | ICD-10-CM | POA: Diagnosis not present

## 2021-10-31 DIAGNOSIS — L298 Other pruritus: Secondary | ICD-10-CM | POA: Diagnosis not present

## 2021-10-31 DIAGNOSIS — Z789 Other specified health status: Secondary | ICD-10-CM | POA: Diagnosis not present

## 2021-10-31 DIAGNOSIS — L538 Other specified erythematous conditions: Secondary | ICD-10-CM | POA: Diagnosis not present

## 2021-10-31 DIAGNOSIS — L281 Prurigo nodularis: Secondary | ICD-10-CM | POA: Diagnosis not present

## 2021-11-04 DIAGNOSIS — E669 Obesity, unspecified: Secondary | ICD-10-CM | POA: Diagnosis not present

## 2021-11-04 DIAGNOSIS — L409 Psoriasis, unspecified: Secondary | ICD-10-CM | POA: Diagnosis not present

## 2021-11-04 DIAGNOSIS — M5136 Other intervertebral disc degeneration, lumbar region: Secondary | ICD-10-CM | POA: Diagnosis not present

## 2021-11-04 DIAGNOSIS — M1991 Primary osteoarthritis, unspecified site: Secondary | ICD-10-CM | POA: Diagnosis not present

## 2021-11-04 DIAGNOSIS — Z79899 Other long term (current) drug therapy: Secondary | ICD-10-CM | POA: Diagnosis not present

## 2021-11-04 DIAGNOSIS — Z6831 Body mass index (BMI) 31.0-31.9, adult: Secondary | ICD-10-CM | POA: Diagnosis not present

## 2021-11-04 DIAGNOSIS — M0609 Rheumatoid arthritis without rheumatoid factor, multiple sites: Secondary | ICD-10-CM | POA: Diagnosis not present

## 2021-11-27 DIAGNOSIS — M0609 Rheumatoid arthritis without rheumatoid factor, multiple sites: Secondary | ICD-10-CM | POA: Diagnosis not present

## 2021-12-03 DIAGNOSIS — L309 Dermatitis, unspecified: Secondary | ICD-10-CM | POA: Diagnosis not present

## 2021-12-03 DIAGNOSIS — L281 Prurigo nodularis: Secondary | ICD-10-CM | POA: Diagnosis not present

## 2021-12-03 DIAGNOSIS — Z789 Other specified health status: Secondary | ICD-10-CM | POA: Diagnosis not present

## 2022-01-01 DIAGNOSIS — L281 Prurigo nodularis: Secondary | ICD-10-CM | POA: Diagnosis not present

## 2022-01-01 DIAGNOSIS — Z789 Other specified health status: Secondary | ICD-10-CM | POA: Diagnosis not present

## 2022-01-01 DIAGNOSIS — L309 Dermatitis, unspecified: Secondary | ICD-10-CM | POA: Diagnosis not present

## 2022-01-01 DIAGNOSIS — L298 Other pruritus: Secondary | ICD-10-CM | POA: Diagnosis not present

## 2022-01-22 DIAGNOSIS — Z79899 Other long term (current) drug therapy: Secondary | ICD-10-CM | POA: Diagnosis not present

## 2022-01-22 DIAGNOSIS — R5383 Other fatigue: Secondary | ICD-10-CM | POA: Diagnosis not present

## 2022-01-22 DIAGNOSIS — M0609 Rheumatoid arthritis without rheumatoid factor, multiple sites: Secondary | ICD-10-CM | POA: Diagnosis not present

## 2022-02-04 DIAGNOSIS — L538 Other specified erythematous conditions: Secondary | ICD-10-CM | POA: Diagnosis not present

## 2022-02-04 DIAGNOSIS — Z789 Other specified health status: Secondary | ICD-10-CM | POA: Diagnosis not present

## 2022-02-04 DIAGNOSIS — L281 Prurigo nodularis: Secondary | ICD-10-CM | POA: Diagnosis not present

## 2022-02-04 DIAGNOSIS — L298 Other pruritus: Secondary | ICD-10-CM | POA: Diagnosis not present

## 2022-02-04 DIAGNOSIS — L309 Dermatitis, unspecified: Secondary | ICD-10-CM | POA: Diagnosis not present

## 2022-03-06 DIAGNOSIS — L281 Prurigo nodularis: Secondary | ICD-10-CM | POA: Diagnosis not present

## 2022-03-06 DIAGNOSIS — L298 Other pruritus: Secondary | ICD-10-CM | POA: Diagnosis not present

## 2022-03-06 DIAGNOSIS — Z789 Other specified health status: Secondary | ICD-10-CM | POA: Diagnosis not present

## 2022-03-19 DIAGNOSIS — M0609 Rheumatoid arthritis without rheumatoid factor, multiple sites: Secondary | ICD-10-CM | POA: Diagnosis not present

## 2022-03-19 DIAGNOSIS — Z79899 Other long term (current) drug therapy: Secondary | ICD-10-CM | POA: Diagnosis not present

## 2022-03-19 DIAGNOSIS — R5383 Other fatigue: Secondary | ICD-10-CM | POA: Diagnosis not present

## 2022-03-23 DIAGNOSIS — L281 Prurigo nodularis: Secondary | ICD-10-CM | POA: Diagnosis not present

## 2022-03-23 DIAGNOSIS — E668 Other obesity: Secondary | ICD-10-CM | POA: Diagnosis not present

## 2022-03-23 DIAGNOSIS — R7303 Prediabetes: Secondary | ICD-10-CM | POA: Diagnosis not present

## 2022-03-23 DIAGNOSIS — M069 Rheumatoid arthritis, unspecified: Secondary | ICD-10-CM | POA: Diagnosis not present

## 2022-03-23 DIAGNOSIS — I10 Essential (primary) hypertension: Secondary | ICD-10-CM | POA: Diagnosis not present

## 2022-03-23 DIAGNOSIS — F330 Major depressive disorder, recurrent, mild: Secondary | ICD-10-CM | POA: Diagnosis not present

## 2022-03-23 DIAGNOSIS — E559 Vitamin D deficiency, unspecified: Secondary | ICD-10-CM | POA: Diagnosis not present

## 2022-03-23 DIAGNOSIS — E039 Hypothyroidism, unspecified: Secondary | ICD-10-CM | POA: Diagnosis not present

## 2022-05-05 DIAGNOSIS — M5136 Other intervertebral disc degeneration, lumbar region: Secondary | ICD-10-CM | POA: Diagnosis not present

## 2022-05-05 DIAGNOSIS — M0609 Rheumatoid arthritis without rheumatoid factor, multiple sites: Secondary | ICD-10-CM | POA: Diagnosis not present

## 2022-05-05 DIAGNOSIS — M1991 Primary osteoarthritis, unspecified site: Secondary | ICD-10-CM | POA: Diagnosis not present

## 2022-05-05 DIAGNOSIS — Z79899 Other long term (current) drug therapy: Secondary | ICD-10-CM | POA: Diagnosis not present

## 2022-05-05 DIAGNOSIS — L409 Psoriasis, unspecified: Secondary | ICD-10-CM | POA: Diagnosis not present

## 2022-05-05 DIAGNOSIS — E663 Overweight: Secondary | ICD-10-CM | POA: Diagnosis not present

## 2022-05-05 DIAGNOSIS — Z6829 Body mass index (BMI) 29.0-29.9, adult: Secondary | ICD-10-CM | POA: Diagnosis not present

## 2022-05-20 DIAGNOSIS — M0609 Rheumatoid arthritis without rheumatoid factor, multiple sites: Secondary | ICD-10-CM | POA: Diagnosis not present

## 2022-07-16 DIAGNOSIS — M0609 Rheumatoid arthritis without rheumatoid factor, multiple sites: Secondary | ICD-10-CM | POA: Diagnosis not present

## 2022-07-28 DIAGNOSIS — L281 Prurigo nodularis: Secondary | ICD-10-CM | POA: Diagnosis not present

## 2022-09-08 DIAGNOSIS — Z7969 Long term (current) use of other immunomodulators and immunosuppressants: Secondary | ICD-10-CM | POA: Diagnosis not present

## 2022-09-08 DIAGNOSIS — Z1211 Encounter for screening for malignant neoplasm of colon: Secondary | ICD-10-CM | POA: Diagnosis not present

## 2022-09-08 DIAGNOSIS — I10 Essential (primary) hypertension: Secondary | ICD-10-CM | POA: Diagnosis not present

## 2022-09-08 DIAGNOSIS — D84821 Immunodeficiency due to drugs: Secondary | ICD-10-CM | POA: Diagnosis not present

## 2022-09-08 DIAGNOSIS — Z0000 Encounter for general adult medical examination without abnormal findings: Secondary | ICD-10-CM | POA: Diagnosis not present

## 2022-09-08 DIAGNOSIS — I700 Atherosclerosis of aorta: Secondary | ICD-10-CM | POA: Diagnosis not present

## 2022-09-08 DIAGNOSIS — Z79899 Other long term (current) drug therapy: Secondary | ICD-10-CM | POA: Diagnosis not present

## 2022-09-08 DIAGNOSIS — E559 Vitamin D deficiency, unspecified: Secondary | ICD-10-CM | POA: Diagnosis not present

## 2022-09-08 DIAGNOSIS — F325 Major depressive disorder, single episode, in full remission: Secondary | ICD-10-CM | POA: Diagnosis not present

## 2022-09-08 DIAGNOSIS — E039 Hypothyroidism, unspecified: Secondary | ICD-10-CM | POA: Diagnosis not present

## 2022-09-08 DIAGNOSIS — R7303 Prediabetes: Secondary | ICD-10-CM | POA: Diagnosis not present

## 2022-09-10 DIAGNOSIS — R5383 Other fatigue: Secondary | ICD-10-CM | POA: Diagnosis not present

## 2022-09-10 DIAGNOSIS — Z79899 Other long term (current) drug therapy: Secondary | ICD-10-CM | POA: Diagnosis not present

## 2022-09-10 DIAGNOSIS — M0609 Rheumatoid arthritis without rheumatoid factor, multiple sites: Secondary | ICD-10-CM | POA: Diagnosis not present

## 2022-09-22 DIAGNOSIS — H6123 Impacted cerumen, bilateral: Secondary | ICD-10-CM | POA: Diagnosis not present

## 2022-09-24 DIAGNOSIS — D649 Anemia, unspecified: Secondary | ICD-10-CM | POA: Diagnosis not present

## 2022-11-05 DIAGNOSIS — M0609 Rheumatoid arthritis without rheumatoid factor, multiple sites: Secondary | ICD-10-CM | POA: Diagnosis not present

## 2022-11-05 DIAGNOSIS — Z79899 Other long term (current) drug therapy: Secondary | ICD-10-CM | POA: Diagnosis not present

## 2022-11-05 DIAGNOSIS — R5383 Other fatigue: Secondary | ICD-10-CM | POA: Diagnosis not present

## 2022-11-10 DIAGNOSIS — L409 Psoriasis, unspecified: Secondary | ICD-10-CM | POA: Diagnosis not present

## 2022-11-10 DIAGNOSIS — M1991 Primary osteoarthritis, unspecified site: Secondary | ICD-10-CM | POA: Diagnosis not present

## 2022-11-10 DIAGNOSIS — Z79899 Other long term (current) drug therapy: Secondary | ICD-10-CM | POA: Diagnosis not present

## 2022-11-10 DIAGNOSIS — E663 Overweight: Secondary | ICD-10-CM | POA: Diagnosis not present

## 2022-11-10 DIAGNOSIS — M5136 Other intervertebral disc degeneration, lumbar region: Secondary | ICD-10-CM | POA: Diagnosis not present

## 2022-11-10 DIAGNOSIS — M0609 Rheumatoid arthritis without rheumatoid factor, multiple sites: Secondary | ICD-10-CM | POA: Diagnosis not present

## 2022-11-10 DIAGNOSIS — Z6829 Body mass index (BMI) 29.0-29.9, adult: Secondary | ICD-10-CM | POA: Diagnosis not present

## 2022-11-23 DIAGNOSIS — Z23 Encounter for immunization: Secondary | ICD-10-CM | POA: Diagnosis not present

## 2022-12-31 DIAGNOSIS — M0609 Rheumatoid arthritis without rheumatoid factor, multiple sites: Secondary | ICD-10-CM | POA: Diagnosis not present

## 2023-01-14 DIAGNOSIS — L2989 Other pruritus: Secondary | ICD-10-CM | POA: Diagnosis not present

## 2023-01-14 DIAGNOSIS — R208 Other disturbances of skin sensation: Secondary | ICD-10-CM | POA: Diagnosis not present

## 2023-01-14 DIAGNOSIS — L538 Other specified erythematous conditions: Secondary | ICD-10-CM | POA: Diagnosis not present

## 2023-01-14 DIAGNOSIS — Z789 Other specified health status: Secondary | ICD-10-CM | POA: Diagnosis not present

## 2023-01-14 DIAGNOSIS — L281 Prurigo nodularis: Secondary | ICD-10-CM | POA: Diagnosis not present

## 2023-02-11 DIAGNOSIS — L2989 Other pruritus: Secondary | ICD-10-CM | POA: Diagnosis not present

## 2023-02-11 DIAGNOSIS — L281 Prurigo nodularis: Secondary | ICD-10-CM | POA: Diagnosis not present

## 2023-02-11 DIAGNOSIS — R208 Other disturbances of skin sensation: Secondary | ICD-10-CM | POA: Diagnosis not present

## 2023-02-11 DIAGNOSIS — Z789 Other specified health status: Secondary | ICD-10-CM | POA: Diagnosis not present

## 2023-02-11 DIAGNOSIS — L538 Other specified erythematous conditions: Secondary | ICD-10-CM | POA: Diagnosis not present

## 2023-02-26 DIAGNOSIS — Z111 Encounter for screening for respiratory tuberculosis: Secondary | ICD-10-CM | POA: Diagnosis not present

## 2023-02-26 DIAGNOSIS — Z79899 Other long term (current) drug therapy: Secondary | ICD-10-CM | POA: Diagnosis not present

## 2023-02-26 DIAGNOSIS — M0609 Rheumatoid arthritis without rheumatoid factor, multiple sites: Secondary | ICD-10-CM | POA: Diagnosis not present

## 2023-02-26 DIAGNOSIS — R5383 Other fatigue: Secondary | ICD-10-CM | POA: Diagnosis not present

## 2023-04-23 DIAGNOSIS — Z79899 Other long term (current) drug therapy: Secondary | ICD-10-CM | POA: Diagnosis not present

## 2023-04-23 DIAGNOSIS — M0609 Rheumatoid arthritis without rheumatoid factor, multiple sites: Secondary | ICD-10-CM | POA: Diagnosis not present

## 2023-04-23 DIAGNOSIS — R5383 Other fatigue: Secondary | ICD-10-CM | POA: Diagnosis not present

## 2023-05-11 DIAGNOSIS — M1991 Primary osteoarthritis, unspecified site: Secondary | ICD-10-CM | POA: Diagnosis not present

## 2023-05-11 DIAGNOSIS — Z79899 Other long term (current) drug therapy: Secondary | ICD-10-CM | POA: Diagnosis not present

## 2023-05-11 DIAGNOSIS — Z6828 Body mass index (BMI) 28.0-28.9, adult: Secondary | ICD-10-CM | POA: Diagnosis not present

## 2023-05-11 DIAGNOSIS — M0609 Rheumatoid arthritis without rheumatoid factor, multiple sites: Secondary | ICD-10-CM | POA: Diagnosis not present

## 2023-05-11 DIAGNOSIS — L409 Psoriasis, unspecified: Secondary | ICD-10-CM | POA: Diagnosis not present

## 2023-05-11 DIAGNOSIS — E663 Overweight: Secondary | ICD-10-CM | POA: Diagnosis not present

## 2023-06-18 DIAGNOSIS — M0609 Rheumatoid arthritis without rheumatoid factor, multiple sites: Secondary | ICD-10-CM | POA: Diagnosis not present

## 2023-08-13 DIAGNOSIS — M0609 Rheumatoid arthritis without rheumatoid factor, multiple sites: Secondary | ICD-10-CM | POA: Diagnosis not present

## 2023-08-18 DIAGNOSIS — L910 Hypertrophic scar: Secondary | ICD-10-CM | POA: Diagnosis not present

## 2023-08-18 DIAGNOSIS — Z789 Other specified health status: Secondary | ICD-10-CM | POA: Diagnosis not present

## 2023-08-18 DIAGNOSIS — R208 Other disturbances of skin sensation: Secondary | ICD-10-CM | POA: Diagnosis not present

## 2023-08-31 DIAGNOSIS — Z79899 Other long term (current) drug therapy: Secondary | ICD-10-CM | POA: Diagnosis not present

## 2023-08-31 DIAGNOSIS — D649 Anemia, unspecified: Secondary | ICD-10-CM | POA: Diagnosis not present

## 2023-08-31 DIAGNOSIS — E039 Hypothyroidism, unspecified: Secondary | ICD-10-CM | POA: Diagnosis not present

## 2023-08-31 DIAGNOSIS — I700 Atherosclerosis of aorta: Secondary | ICD-10-CM | POA: Diagnosis not present

## 2023-08-31 DIAGNOSIS — D84821 Immunodeficiency due to drugs: Secondary | ICD-10-CM | POA: Diagnosis not present

## 2023-08-31 DIAGNOSIS — I10 Essential (primary) hypertension: Secondary | ICD-10-CM | POA: Diagnosis not present

## 2023-08-31 DIAGNOSIS — E559 Vitamin D deficiency, unspecified: Secondary | ICD-10-CM | POA: Diagnosis not present

## 2023-08-31 DIAGNOSIS — M069 Rheumatoid arthritis, unspecified: Secondary | ICD-10-CM | POA: Diagnosis not present

## 2023-08-31 DIAGNOSIS — R7303 Prediabetes: Secondary | ICD-10-CM | POA: Diagnosis not present

## 2023-10-06 DIAGNOSIS — E559 Vitamin D deficiency, unspecified: Secondary | ICD-10-CM | POA: Diagnosis not present

## 2023-10-06 DIAGNOSIS — I10 Essential (primary) hypertension: Secondary | ICD-10-CM | POA: Diagnosis not present

## 2023-10-06 DIAGNOSIS — N63 Unspecified lump in unspecified breast: Secondary | ICD-10-CM | POA: Diagnosis not present

## 2023-10-06 DIAGNOSIS — E039 Hypothyroidism, unspecified: Secondary | ICD-10-CM | POA: Diagnosis not present

## 2023-10-06 DIAGNOSIS — D649 Anemia, unspecified: Secondary | ICD-10-CM | POA: Diagnosis not present

## 2023-10-06 DIAGNOSIS — D84821 Immunodeficiency due to drugs: Secondary | ICD-10-CM | POA: Diagnosis not present

## 2023-10-06 DIAGNOSIS — I700 Atherosclerosis of aorta: Secondary | ICD-10-CM | POA: Diagnosis not present

## 2023-10-06 DIAGNOSIS — F17210 Nicotine dependence, cigarettes, uncomplicated: Secondary | ICD-10-CM | POA: Diagnosis not present

## 2023-10-06 DIAGNOSIS — Z7969 Long term (current) use of other immunomodulators and immunosuppressants: Secondary | ICD-10-CM | POA: Diagnosis not present

## 2023-10-06 DIAGNOSIS — R7303 Prediabetes: Secondary | ICD-10-CM | POA: Diagnosis not present

## 2023-10-06 DIAGNOSIS — Z0000 Encounter for general adult medical examination without abnormal findings: Secondary | ICD-10-CM | POA: Diagnosis not present

## 2023-10-06 DIAGNOSIS — E538 Deficiency of other specified B group vitamins: Secondary | ICD-10-CM | POA: Diagnosis not present

## 2023-10-08 DIAGNOSIS — M0609 Rheumatoid arthritis without rheumatoid factor, multiple sites: Secondary | ICD-10-CM | POA: Diagnosis not present

## 2023-10-08 DIAGNOSIS — Z79899 Other long term (current) drug therapy: Secondary | ICD-10-CM | POA: Diagnosis not present

## 2023-10-20 DIAGNOSIS — Z23 Encounter for immunization: Secondary | ICD-10-CM | POA: Diagnosis not present

## 2023-10-20 DIAGNOSIS — E538 Deficiency of other specified B group vitamins: Secondary | ICD-10-CM | POA: Diagnosis not present

## 2023-10-21 DIAGNOSIS — Z1211 Encounter for screening for malignant neoplasm of colon: Secondary | ICD-10-CM | POA: Diagnosis not present

## 2023-11-03 DIAGNOSIS — R928 Other abnormal and inconclusive findings on diagnostic imaging of breast: Secondary | ICD-10-CM | POA: Diagnosis not present

## 2023-11-03 DIAGNOSIS — R92313 Mammographic fatty tissue density, bilateral breasts: Secondary | ICD-10-CM | POA: Diagnosis not present

## 2023-11-09 DIAGNOSIS — Z79899 Other long term (current) drug therapy: Secondary | ICD-10-CM | POA: Diagnosis not present

## 2023-11-09 DIAGNOSIS — M1991 Primary osteoarthritis, unspecified site: Secondary | ICD-10-CM | POA: Diagnosis not present

## 2023-11-09 DIAGNOSIS — Z6828 Body mass index (BMI) 28.0-28.9, adult: Secondary | ICD-10-CM | POA: Diagnosis not present

## 2023-11-09 DIAGNOSIS — L409 Psoriasis, unspecified: Secondary | ICD-10-CM | POA: Diagnosis not present

## 2023-11-09 DIAGNOSIS — E663 Overweight: Secondary | ICD-10-CM | POA: Diagnosis not present

## 2023-11-09 DIAGNOSIS — M0609 Rheumatoid arthritis without rheumatoid factor, multiple sites: Secondary | ICD-10-CM | POA: Diagnosis not present

## 2023-12-03 DIAGNOSIS — M0609 Rheumatoid arthritis without rheumatoid factor, multiple sites: Secondary | ICD-10-CM | POA: Diagnosis not present

## 2023-12-20 DIAGNOSIS — I10 Essential (primary) hypertension: Secondary | ICD-10-CM | POA: Diagnosis not present

## 2024-01-06 DIAGNOSIS — I10 Essential (primary) hypertension: Secondary | ICD-10-CM | POA: Diagnosis not present

## 2024-01-06 DIAGNOSIS — E663 Overweight: Secondary | ICD-10-CM | POA: Diagnosis not present

## 2024-01-06 DIAGNOSIS — E039 Hypothyroidism, unspecified: Secondary | ICD-10-CM | POA: Diagnosis not present

## 2024-01-06 DIAGNOSIS — E559 Vitamin D deficiency, unspecified: Secondary | ICD-10-CM | POA: Diagnosis not present

## 2024-01-06 DIAGNOSIS — E538 Deficiency of other specified B group vitamins: Secondary | ICD-10-CM | POA: Diagnosis not present

## 2024-01-06 DIAGNOSIS — M069 Rheumatoid arthritis, unspecified: Secondary | ICD-10-CM | POA: Diagnosis not present

## 2024-01-06 DIAGNOSIS — R7303 Prediabetes: Secondary | ICD-10-CM | POA: Diagnosis not present

## 2024-01-28 DIAGNOSIS — M0609 Rheumatoid arthritis without rheumatoid factor, multiple sites: Secondary | ICD-10-CM | POA: Diagnosis not present
